# Patient Record
Sex: Male | Born: 1960 | Race: White | Hispanic: No | Marital: Married | Smoking: Never smoker
Health system: Southern US, Community
[De-identification: ages and names within clinical notes are randomized; demographics above are authoritative.]

## PROBLEM LIST (undated history)

## (undated) DIAGNOSIS — E785 Hyperlipidemia, unspecified: Secondary | ICD-10-CM

## (undated) DIAGNOSIS — N289 Disorder of kidney and ureter, unspecified: Secondary | ICD-10-CM

---

## 2005-11-20 ENCOUNTER — Inpatient Hospital Stay (HOSPITAL_COMMUNITY): Admission: EM | Admit: 2005-11-20 | Discharge: 2005-11-21 | Payer: Self-pay | Admitting: Emergency Medicine

## 2005-11-20 ENCOUNTER — Ambulatory Visit: Payer: Self-pay | Admitting: Internal Medicine

## 2005-11-26 ENCOUNTER — Ambulatory Visit: Payer: Self-pay

## 2012-01-07 ENCOUNTER — Emergency Department (HOSPITAL_COMMUNITY): Payer: BC Managed Care – PPO

## 2012-01-07 ENCOUNTER — Emergency Department (HOSPITAL_COMMUNITY)
Admission: EM | Admit: 2012-01-07 | Discharge: 2012-01-08 | Disposition: A | Discharge status: Home / Self Care | Payer: BC Managed Care – PPO | Attending: Emergency Medicine | Admitting: Emergency Medicine

## 2012-01-07 ENCOUNTER — Encounter (HOSPITAL_COMMUNITY): Payer: Self-pay | Admitting: *Deleted

## 2012-01-07 DIAGNOSIS — R0602 Shortness of breath: Secondary | ICD-10-CM | POA: Insufficient documentation

## 2012-01-07 DIAGNOSIS — R059 Cough, unspecified: Secondary | ICD-10-CM | POA: Insufficient documentation

## 2012-01-07 DIAGNOSIS — R05 Cough: Secondary | ICD-10-CM | POA: Insufficient documentation

## 2012-01-07 DIAGNOSIS — J40 Bronchitis, not specified as acute or chronic: Secondary | ICD-10-CM

## 2012-01-07 DIAGNOSIS — E785 Hyperlipidemia, unspecified: Secondary | ICD-10-CM | POA: Insufficient documentation

## 2012-01-07 HISTORY — DX: Hyperlipidemia, unspecified: E78.5

## 2012-01-07 MED ORDER — HYDROCOD POLST-CHLORPHEN POLST 10-8 MG/5ML PO LQCR
5.0000 mL | Freq: Once | ORAL | Status: AC
  Administered 2012-01-07: 5 mL via ORAL
  Filled 2012-01-07: qty 5

## 2012-01-07 MED ORDER — ALBUTEROL SULFATE HFA 108 (90 BASE) MCG/ACT IN AERS
2.0000 | INHALATION_SPRAY | Freq: Once | RESPIRATORY_TRACT | Status: AC
  Administered 2012-01-07: 2 via RESPIRATORY_TRACT
  Filled 2012-01-07: qty 6.7

## 2012-01-07 NOTE — ED Notes (Signed)
PA [facility] at bedside. 

## 2012-01-07 NOTE — ED Notes (Signed)
Pt c/o shortness of breath with cough for one week. Pt states cough is unproductive, but he can feel congestion in his chest. Pt states he has recently been treated for an ear infection. Pt states he feels "foggy".

## 2012-01-08 MED ORDER — HYDROCOD POLST-CHLORPHEN POLST 10-8 MG/5ML PO LQCR: 5.0000 mL | Freq: Two times a day (BID) | ORAL | Status: DC | PRN

## 2012-01-08 MED ORDER — AZITHROMYCIN 250 MG PO TABS: ORAL_TABLET | ORAL | Status: AC

## 2012-01-08 MED ORDER — AZITHROMYCIN 250 MG PO TABS
500.0000 mg | ORAL_TABLET | Freq: Once | ORAL | Status: AC
  Administered 2012-01-08: 500 mg via ORAL
  Filled 2012-01-08: qty 2

## 2012-01-08 NOTE — ED Provider Notes (Signed)
History     CSN: [id number]  Arrival date & time 01/07/12  2049   First MD Initiated Contact with Patient 01/07/12 2310      Chief Complaint  Patient presents with  . Shortness of Breath  . Cough    (Consider location/radiation/quality/duration/timing/severity/associated sxs/prior treatment) HPI Comments: Patient here with a day history of cough, shortness of breath and wheezing - states that he saw his PCP this past week, was diagnosed with an ear infection, place on amoxicillin for this but developed the cough without sputum production later in the week - he denies chest pain, reports shortness of breath particularly with deep cough, denies nausea, vomiting, runny nose, continued ear pain, sore throat - states has rattling sensation and wheezing noted in his chest when he coughs but is unable to bring anything up.  Denies fever but reports chills and night sweats.  Patient is a 51 y.o. male presenting with shortness of breath and cough. The history is provided by the patient. No language interpreter was used.  Shortness of Breath  The current episode started yesterday. The onset was gradual. The problem occurs frequently. The problem has been unchanged. The problem is moderate. The symptoms are relieved by nothing. The symptoms are aggravated by activity and a supine position. Associated symptoms include cough, shortness of breath and wheezing. Pertinent negatives include no chest pain, no chest pressure, no orthopnea, no fever, no rhinorrhea, no sore throat and no stridor. There was no intake of a foreign body. He has not inhaled smoke recently. He has had no prior steroid use. He has had no prior hospitalizations. He has had no prior ICU admissions. He has had no prior intubations. His past medical history does not include asthma, past wheezing or asthma in the family. Urine output has been normal. The last void occurred less than 6 hours ago. There were no sick contacts.  Cough Associated  symptoms include chills, shortness of breath and wheezing. Pertinent negatives include no chest pain, no rhinorrhea and no sore throat. His past medical history does not include asthma.    Past Medical History  Diagnosis Date  . Hyperlipidemia     No past surgical history on file.  History reviewed. No pertinent family history.  History  Substance Use Topics  . Smoking status: Never Smoker   . Smokeless tobacco: Not on file  . Alcohol Use: No      Review of Systems  Constitutional: Positive for chills. Negative for fever.  HENT: Negative for sore throat and rhinorrhea.   Respiratory: Positive for cough, shortness of breath and wheezing. Negative for stridor.   Cardiovascular: Negative for chest pain and orthopnea.  All other systems reviewed and are negative.    Allergies  Review of patient's allergies indicates no known allergies.  Home Medications   Current Outpatient Rx  Name Route Sig Dispense Refill  . ATORVASTATIN CALCIUM 80 MG PO TABS Oral Take 80 mg by mouth daily.    . BUPROPION HCL ER (SR) 150 MG PO TB12 Oral Take 150 mg by mouth 2 (two) times daily.    Marland Kitchen DM-PHENYLEPHRINE-ACETAMINOPHEN 10-5-325 MG/15ML PO LIQD Oral Take 15 mLs by mouth every 6 (six) hours as needed. Cold/ cough relief    . HYDROCODONE-HOMATROPINE 5-1.5 MG/5ML PO SYRP Oral Take 5 mLs by mouth every 6 (six) hours as needed. Cough suppresant    . IBUPROFEN 100 MG PO CHEW Oral Chew 100 mg by mouth every 8 (eight) hours as needed. Pain  BP 148/97  Pulse 80  Temp(Src) 98 F (36.7 C) (Oral)  Resp 20  SpO2 94%  Physical Exam  Nursing note and vitals reviewed. Constitutional: He is oriented to person, place, and time. He appears well-developed and well-nourished. No distress.  HENT:  Head: Normocephalic and atraumatic.  Right Ear: External ear normal.  Left Ear: External ear normal.  Nose: Nose normal.  Mouth/Throat: Oropharynx is clear and moist. No oropharyngeal exudate.  Eyes:  Conjunctivae are normal. Pupils are equal, round, and reactive to light. No scleral icterus.  Neck: Normal range of motion. Neck supple.  Cardiovascular: Normal rate, regular rhythm and normal heart sounds.  Exam reveals no gallop and no friction rub.   No murmur heard. Pulmonary/Chest: Effort normal. No respiratory distress. He has no wheezes. He has rhonchi in the right lower field. He has no rales. He exhibits no tenderness.       Rhonchi noted to RLL  Abdominal: Soft. Bowel sounds are normal. He exhibits no distension and no mass. There is no tenderness. There is no rebound and no guarding.  Musculoskeletal: Normal range of motion. He exhibits no edema and no tenderness.  Lymphadenopathy:    He has no cervical adenopathy.  Neurological: He is alert and oriented to person, place, and time. No cranial nerve deficit.  Skin: Skin is warm and dry. No rash noted. No erythema. No pallor.  Psychiatric: He has a normal mood and affect. His behavior is normal. Judgment and thought content normal.    ED Course  Procedures (including critical care time)  Labs Reviewed - No data to display Dg Chest 2 View  01/08/2012  *RADIOLOGY REPORT*  Clinical Data: Nonproductive cough.  Chest congestion.  Difficulty breathing.  Nonsmoker.  CHEST - 2 VIEW  Comparison: None.  Findings: Segmental elevations of the hemidiaphragms bilaterally. Normal heart size and pulmonary vascularity.  No focal airspace consolidation in the lungs.  No blunting of costophrenic angles. No pneumothorax.  IMPRESSION: No evidence of active pulmonary disease.  Original Report Authenticated By: Marlon Pel, M.D.     Bronchitis    MDM  Patient with recent history of OM who presents with cough and congestion that is non-productive - mild rhonchi noted to RLL with cough only - no chest pain with mild shortness of breath - feels better after tussionex and albuterol - I believe this to be more bronchitis in nature and will treat as  such.          Izola Price [place], [place] 01/08/12 [phone]

## 2012-01-08 NOTE — Discharge Instructions (Signed)

## 2012-01-08 NOTE — ED Notes (Signed)
Patient transported to X-ray 

## 2012-01-08 NOTE — ED Provider Notes (Signed)
Medical screening examination/treatment/procedure(s) were performed by non-physician practitioner and as supervising physician I was immediately available for consultation/collaboration.   Gavin Pound. Oletta Lamas, MD 01/08/12 [phone]

## 2012-01-08 NOTE — ED Notes (Signed)
PA [facility] at bedside. 

## 2015-09-07 ENCOUNTER — Emergency Department (HOSPITAL_COMMUNITY): Payer: Managed Care, Other (non HMO)

## 2015-09-07 ENCOUNTER — Encounter (HOSPITAL_COMMUNITY): Payer: Self-pay | Admitting: Emergency Medicine

## 2015-09-07 ENCOUNTER — Emergency Department (HOSPITAL_COMMUNITY)
Admission: EM | Admit: 2015-09-07 | Discharge: 2015-09-07 | Disposition: A | Discharge status: Home / Self Care | Payer: Managed Care, Other (non HMO) | Attending: Emergency Medicine | Admitting: Emergency Medicine

## 2015-09-07 DIAGNOSIS — E785 Hyperlipidemia, unspecified: Secondary | ICD-10-CM | POA: Diagnosis not present

## 2015-09-07 DIAGNOSIS — N200 Calculus of kidney: Secondary | ICD-10-CM | POA: Diagnosis not present

## 2015-09-07 DIAGNOSIS — R52 Pain, unspecified: Secondary | ICD-10-CM

## 2015-09-07 DIAGNOSIS — R1032 Left lower quadrant pain: Secondary | ICD-10-CM | POA: Diagnosis present

## 2015-09-07 DIAGNOSIS — Z79899 Other long term (current) drug therapy: Secondary | ICD-10-CM | POA: Diagnosis not present

## 2015-09-07 LAB — COMPREHENSIVE METABOLIC PANEL
ALT: 20 U/L (ref 17–63)
ANION GAP: 11 (ref 5–15)
AST: 18 U/L (ref 15–41)
Albumin: 4.4 g/dL (ref 3.5–5.0)
Alkaline Phosphatase: 65 U/L (ref 38–126)
BILIRUBIN TOTAL: 0.9 mg/dL (ref 0.3–1.2)
BUN: 22 mg/dL — ABNORMAL HIGH (ref 6–20)
CALCIUM: 9.6 mg/dL (ref 8.9–10.3)
CO2: 24 mmol/L (ref 22–32)
Chloride: 104 mmol/L (ref 101–111)
Creatinine, Ser: 1.33 mg/dL — ABNORMAL HIGH (ref 0.61–1.24)
GFR, EST NON AFRICAN AMERICAN: 60 mL/min — AB (ref >60)
Glucose, Bld: 115 mg/dL — ABNORMAL HIGH (ref 65–99)
POTASSIUM: 4.2 mmol/L (ref 3.5–5.1)
Sodium: 139 mmol/L (ref 135–145)
TOTAL PROTEIN: 6.9 g/dL (ref 6.5–8.1)

## 2015-09-07 LAB — URINE MICROSCOPIC-ADD ON
Bacteria, UA: NONE SEEN
SQUAMOUS EPITHELIAL / LPF: NONE SEEN

## 2015-09-07 LAB — CBC
HEMATOCRIT: 42.9 % (ref 39.0–52.0)
HEMOGLOBIN: 14.2 g/dL (ref 13.0–17.0)
MCH: 29.5 pg (ref 26.0–34.0)
MCHC: 33.1 g/dL (ref 30.0–36.0)
MCV: 89.2 fL (ref 78.0–100.0)
Platelets: 290 10*3/uL (ref 150–400)
RBC: 4.81 MIL/uL (ref 4.22–5.81)
RDW: 13.2 % (ref 11.5–15.5)
WBC: 9.1 10*3/uL (ref 4.0–10.5)

## 2015-09-07 LAB — URINALYSIS, ROUTINE W REFLEX MICROSCOPIC
Bilirubin Urine: NEGATIVE
Glucose, UA: NEGATIVE mg/dL
KETONES UR: NEGATIVE mg/dL
LEUKOCYTES UA: NEGATIVE
NITRITE: NEGATIVE
PH: 6 (ref 5.0–8.0)
Protein, ur: NEGATIVE mg/dL
SPECIFIC GRAVITY, URINE: 1.02 (ref 1.005–1.030)

## 2015-09-07 LAB — LIPASE, BLOOD: LIPASE: 27 U/L (ref 11–51)

## 2015-09-07 MED ORDER — HYDROMORPHONE HCL 1 MG/ML IJ SOLN
1.0000 mg | Freq: Once | INTRAMUSCULAR | Status: AC
  Administered 2015-09-07: 1 mg via INTRAVENOUS
  Filled 2015-09-07: qty 1

## 2015-09-07 MED ORDER — OXYCODONE-ACETAMINOPHEN 5-325 MG PO TABS: 1.0000 | ORAL_TABLET | Freq: Four times a day (QID) | ORAL | Status: AC | PRN

## 2015-09-07 MED ORDER — TAMSULOSIN HCL 0.4 MG PO CAPS: 0.4000 mg | ORAL_CAPSULE | Freq: Every day | ORAL | Status: AC

## 2015-09-07 MED ORDER — ONDANSETRON HCL 4 MG/2ML IJ SOLN
4.0000 mg | Freq: Once | INTRAMUSCULAR | Status: AC
  Administered 2015-09-07: 4 mg via INTRAVENOUS
  Filled 2015-09-07: qty 2

## 2015-09-07 MED ORDER — SODIUM CHLORIDE 0.9 % IV BOLUS (SEPSIS)
500.0000 mL | Freq: Once | INTRAVENOUS | Status: AC
  Administered 2015-09-07: 500 mL via INTRAVENOUS

## 2015-09-07 MED ORDER — ONDANSETRON 4 MG PO TBDP: ORAL_TABLET | ORAL | Status: DC

## 2015-09-07 MED ORDER — KETOROLAC TROMETHAMINE 30 MG/ML IJ SOLN
30.0000 mg | Freq: Once | INTRAMUSCULAR | Status: AC
  Administered 2015-09-07: 30 mg via INTRAVENOUS
  Filled 2015-09-07: qty 1

## 2015-09-07 NOTE — Discharge Instructions (Signed)
Follow up with [facility] in a week,

## 2015-09-07 NOTE — ED Provider Notes (Signed)
CSN: [id number]     Arrival date & time 09/07/15  [id number] History   First MD Initiated Contact with Patient 09/07/15 [id number]     Chief Complaint  Patient presents with  . Abdominal Pain     (Consider location/radiation/quality/duration/timing/severity/associated sxs/prior Treatment) Patient is a 54 y.o. male presenting with abdominal pain. The history is provided by the patient (The patient complains of left lower quadrant abdominal pain).  Abdominal Pain Pain location:  L flank Pain quality: aching   Pain radiates to:  Does not radiate Pain severity:  Moderate Onset quality:  Sudden Timing:  Constant Progression:  Worsening Chronicity:  New Context: not alcohol use   Associated symptoms: no chest pain, no cough, no diarrhea, no fatigue and no hematuria     Past Medical History  Diagnosis Date  . Hyperlipidemia    History reviewed. No pertinent past surgical history. No family history on file. Social History  Substance Use Topics  . Smoking status: Never Smoker   . Smokeless tobacco: None  . Alcohol Use: No    Review of Systems  Constitutional: Negative for appetite change and fatigue.  HENT: Negative for congestion, ear discharge and sinus pressure.   Eyes: Negative for discharge.  Respiratory: Negative for cough.   Cardiovascular: Negative for chest pain.  Gastrointestinal: Positive for abdominal pain. Negative for diarrhea.  Genitourinary: Negative for frequency and hematuria.  Musculoskeletal: Negative for back pain.  Skin: Negative for rash.  Neurological: Negative for seizures and headaches.  Psychiatric/Behavioral: Negative for hallucinations.      Allergies  Review of patient's allergies indicates no known allergies.  Home Medications   Prior to Admission medications   Medication Sig Start Date End Date Taking? Authorizing Provider  aspirin-acetaminophen-caffeine (EXCEDRIN MIGRAINE) [phone] MG tablet Take 2 tablets by mouth every 6 (six) hours as  needed for headache.   Yes Historical Provider, MD  buPROPion (WELLBUTRIN SR) 150 MG 12 hr tablet Take 150 mg by mouth daily as needed (for depressive feelings).    Yes Historical Provider, MD  ibuprofen (ADVIL,MOTRIN) 200 MG tablet Take 600 mg by mouth every 6 (six) hours as needed for moderate pain.   Yes Historical Provider, MD  Omega-3 Fatty Acids (FISH OIL) 1000 MG CAPS Take 1,000 mg by mouth daily.   Yes Historical Provider, MD  VIAGRA 100 MG tablet take 1 tablet by mouth daily as needed for sexual intercourse 08/12/15  Yes Historical Provider, MD  ondansetron (ZOFRAN ODT) 4 MG disintegrating tablet 4mg  ODT q4 hours prn nausea/vomit 09/07/15   Bethann BerkshireJoseph Labella Zahradnik, MD  oxyCODONE-acetaminophen (PERCOCET) 5-325 MG tablet Take 1 tablet by mouth every 6 (six) hours as needed. 09/07/15   Bethann BerkshireJoseph Tiare Rohlman, MD  tamsulosin (FLOMAX) 0.4 MG CAPS capsule Take 1 capsule (0.4 mg total) by mouth daily. 09/07/15   Bethann BerkshireJoseph Quinnten Calvin, MD   BP 137/88 mmHg  Pulse 66  Temp(Src) 98.1 F (36.7 C) (Oral)  Resp 14  Ht 5\' 11"  (1.803 m)  Wt 220 lb (99.791 kg)  BMI 30.70 kg/m2  SpO2 94% Physical Exam  Constitutional: He is oriented to person, place, and time. He appears well-developed.  HENT:  Head: Normocephalic.  Eyes: Conjunctivae and EOM are normal. No scleral icterus.  Neck: Neck supple. No thyromegaly present.  Cardiovascular: Normal rate and regular rhythm.  Exam reveals no gallop and no friction rub.   No murmur heard. Pulmonary/Chest: No stridor. He has no wheezes. He has no rales. He exhibits no tenderness.  Abdominal: He exhibits no distension.  There is tenderness. There is no rebound.  Tender left lower quadrant  Musculoskeletal: Normal range of motion. He exhibits no edema.  Lymphadenopathy:    He has no cervical adenopathy.  Neurological: He is oriented to person, place, and time. He exhibits normal muscle tone. Coordination normal.  Skin: No rash noted. No erythema.  Psychiatric: He has a normal mood  and affect. His behavior is normal.    ED Course  Procedures (including critical care time) Labs Review Labs Reviewed  COMPREHENSIVE METABOLIC PANEL - Abnormal; Notable for the following:    Glucose, Bld 115 (*)    BUN 22 (*)    Creatinine, Ser 1.33 (*)    GFR calc non Af Amer 60 (*)    All other components within normal limits  URINALYSIS, ROUTINE W REFLEX MICROSCOPIC (NOT AT [facility]) - Abnormal; Notable for the following:    Hgb urine dipstick SMALL (*)    All other components within normal limits  CBC  LIPASE, BLOOD  URINE MICROSCOPIC-ADD ON    Imaging Review Ct Renal Stone Study  09/07/2015  CLINICAL DATA:  Left abdominal pain radiating into the left groin EXAM: CT ABDOMEN AND PELVIS WITHOUT CONTRAST TECHNIQUE: Multidetector CT imaging of the abdomen and pelvis was performed following the standard protocol without IV contrast. COMPARISON:  None. FINDINGS: Mild left hydronephrosis is associated with a 2 mm calculus at the left ureterovesical junction. Bilateral nephrolithiasis is present. The largest renal calculus measures 10 mm in the lower pole of the right kidney. No right ureteral calculus. Liver, gallbladder, spleen, pancreas, adrenal glands are within normal limits Normal appendix Bladder and prostate are unremarkable.  Sigmoid diverticulosis. Left inguinal hernia contains adipose tissue only. No vertebral compression deformity. IMPRESSION: Bilateral nephrolithiasis. 2 mm left ureterovesical junction calculus is associated with mild left hydronephrosis. Electronically Signed   By: Jolaine Click M.D.   On: 09/07/2015 10:08   I have personally reviewed and evaluated these images and lab results as part of my medical decision-making.   EKG Interpretation None      MDM   Final diagnoses:  Pain  Kidney stone    CT shows kidney stone and left ureter. Patient will be given Percocets Zofran and Flomax and is instructed to follow-up with urology    Bethann Berkshire, MD 09/07/15  1120

## 2015-09-07 NOTE — ED Notes (Signed)
Pt escorted to discharge window. Pt verbalized understanding discharge instructions. In no acute distress.  

## 2015-09-07 NOTE — ED Notes (Addendum)
Pt from home c/o left side abdominal pain that radiates to groin. Denies urinary symptoms.  Pt reports dry heaves no actual vomiting, denies diarrhea.

## 2015-09-07 NOTE — ED Notes (Signed)
md at bedside  Pt alert and oriented x4. Respirations even and unlabored, bilateral symmetrical rise and fall of chest. Skin warm and dry. In no acute distress. Denies needs.   

## 2015-12-04 ENCOUNTER — Emergency Department (HOSPITAL_COMMUNITY): Payer: Managed Care, Other (non HMO)

## 2015-12-04 ENCOUNTER — Emergency Department (HOSPITAL_COMMUNITY)
Admission: EM | Admit: 2015-12-04 | Discharge: 2015-12-05 | Disposition: A | Discharge status: Home / Self Care | Payer: Managed Care, Other (non HMO) | Attending: Emergency Medicine | Admitting: Emergency Medicine

## 2015-12-04 ENCOUNTER — Encounter (HOSPITAL_COMMUNITY): Payer: Self-pay | Admitting: Emergency Medicine

## 2015-12-04 DIAGNOSIS — R1032 Left lower quadrant pain: Secondary | ICD-10-CM | POA: Diagnosis not present

## 2015-12-04 DIAGNOSIS — Z8639 Personal history of other endocrine, nutritional and metabolic disease: Secondary | ICD-10-CM | POA: Insufficient documentation

## 2015-12-04 DIAGNOSIS — M791 Myalgia: Secondary | ICD-10-CM | POA: Diagnosis not present

## 2015-12-04 DIAGNOSIS — R1013 Epigastric pain: Secondary | ICD-10-CM | POA: Diagnosis not present

## 2015-12-04 DIAGNOSIS — R1012 Left upper quadrant pain: Secondary | ICD-10-CM | POA: Diagnosis not present

## 2015-12-04 DIAGNOSIS — Z87442 Personal history of urinary calculi: Secondary | ICD-10-CM | POA: Diagnosis not present

## 2015-12-04 DIAGNOSIS — R1011 Right upper quadrant pain: Secondary | ICD-10-CM | POA: Insufficient documentation

## 2015-12-04 DIAGNOSIS — Z79899 Other long term (current) drug therapy: Secondary | ICD-10-CM | POA: Diagnosis not present

## 2015-12-04 DIAGNOSIS — M7918 Myalgia, other site: Secondary | ICD-10-CM

## 2015-12-04 DIAGNOSIS — R109 Unspecified abdominal pain: Secondary | ICD-10-CM | POA: Diagnosis present

## 2015-12-04 MED ORDER — KETOROLAC TROMETHAMINE 15 MG/ML IJ SOLN
15.0000 mg | Freq: Once | INTRAMUSCULAR | Status: AC
  Administered 2015-12-05: 15 mg via INTRAVENOUS
  Filled 2015-12-04: qty 1

## 2015-12-04 MED ORDER — DIAZEPAM 2 MG PO TABS
2.0000 mg | ORAL_TABLET | Freq: Once | ORAL | Status: AC
  Administered 2015-12-05: 2 mg via ORAL
  Filled 2015-12-04: qty 1

## 2015-12-04 NOTE — ED Provider Notes (Signed)
CSN: [id number]     Arrival date & time 12/04/15  2011 History  By signing my name below, I, Randall Singh, attest that this documentation has been prepared under the direction and in the presence of Derwood Kaplan, MD. Electronically Signed: Bethel Singh, ED Scribe. 12/05/2015. 2:14 AM   Chief Complaint  Patient presents with  . Flank Pain    The history is provided by the patient. No language interpreter was used.   Randall Singh is a 55 y.o. male with history of kidney stones who presents to the Emergency Department complaining of worsening, 9/10 in severity, sharp/throbbing left flank pain with onset 2-3 days ago. The pain was intermittent but has been constant today. When he lays flat the pain radiates to the left side of the abdomen. Walking and position changes exacerbate the pain. He has had similar symptoms in the past with kidney stones. He last passed a stone 4 months ago and it was on the left. Pt states that he has known stones on both sides. Associated symptoms include night sweats for 2 nights and chills. Pt denies nausea, vomiting, sweating during the day, fever, cough, SOB, dysuria, hematuria, increased frequency, rash, and chest pain. No personal history of cancer. No history of abdominal surgery. He is not currently on any steroids or diet pills. Pt denies smoking and heavy alcohol use.   Past Medical History  Diagnosis Date  . Hyperlipidemia    History reviewed. No pertinent past surgical history. Family History  Problem Relation Age of Onset  . CAD Father   . Pancreatitis Father    Social History  Substance Use Topics  . Smoking status: Never Smoker   . Smokeless tobacco: None  . Alcohol Use: No    Review of Systems  10 Systems reviewed and all are negative for acute change except as noted in the HPI.   Allergies  Review of patient's allergies indicates no known allergies.  Home Medications   Prior to Admission medications   Medication Sig Start  Date End Date Taking? Authorizing Provider  buPROPion (WELLBUTRIN SR) 150 MG 12 hr tablet Take 150 mg by mouth daily as needed (for depressive feelings).    Yes Historical Provider, MD  lisinopril (PRINIVIL,ZESTRIL) 10 MG tablet Take 10 mg by mouth daily. 10/31/15  Yes Historical Provider, MD  VIAGRA 100 MG tablet take 100 mg by mouth daily as needed for sexual intercourse 08/12/15  Yes Historical Provider, MD  methocarbamol (ROBAXIN) 500 MG tablet Take 1 tablet (500 mg total) by mouth 2 (two) times daily. 12/05/15   Derwood Kaplan, MD  naproxen (NAPROSYN) 500 MG tablet Take 1 tablet (500 mg total) by mouth 2 (two) times daily with a meal. 12/05/15   Derwood Kaplan, MD  ondansetron (ZOFRAN ODT) 4 MG disintegrating tablet  ODT q4 hours prn nausea/vomit Patient not taking: Reported on 12/04/2015 09/07/15   Bethann Berkshire, MD  oxyCODONE-acetaminophen (PERCOCET) 5-325 MG tablet Take 1 tablet by mouth every 6 (six) hours as needed. Patient not taking: Reported on 12/04/2015 09/07/15   Bethann Berkshire, MD  tamsulosin (FLOMAX) 0.4 MG CAPS capsule Take 1 capsule (0.4 mg total) by mouth daily. Patient not taking: Reported on 12/04/2015 09/07/15   Bethann Berkshire, MD   BP 123/89 mmHg  Pulse 76  Temp(Src) 98.1 F (36.7 C) (Oral)  Resp 15  Ht  (1.803 m)  Wt 225 lb (102.059 kg)  BMI 31.39 kg/m2  SpO2 99% Physical Exam  Constitutional: He is oriented to person,  place, and time. He appears well-developed and well-nourished.  HENT:  Head: Normocephalic and atraumatic.  Eyes: EOM are normal.  Neck: Normal range of motion.  Cardiovascular: Normal rate, regular rhythm, normal heart sounds and intact distal pulses.   Pulses:      Radial pulses are 2+ on the right side, and 2+ on the left side.  2+ and equal radial pulse bilaterally  Pulmonary/Chest: Effort normal and breath sounds normal. No respiratory distress.  CTAB  Abdominal: Soft. He exhibits no distension. There is tenderness in the right upper  quadrant, epigastric area, left upper quadrant and left lower quadrant.  Musculoskeletal: Normal range of motion.  No point tenderness over the spine Left sided lower paraspinal tenderness  Neurological: He is alert and oriented to person, place, and time.  Skin: Skin is warm and dry.  No rash noted to the back or torso  Psychiatric: He has a normal mood and affect. Judgment normal.  Nursing note and vitals reviewed.   ED Course  Procedures (including critical care time) DIAGNOSTIC STUDIES: Oxygen Saturation is 99% on RA,  normal by my interpretation.    COORDINATION OF CARE: 11:28 PM Discussed treatment plan which includes lab work, renal US, pain management with pt at bedside and pt agreed to plan.  2:11 AM I re-evaluated the patient and provided an update on the results of US. On repeat exam abd is soft and non tender. Pt continues to have left paraspinal TTP, worse with movement.    Labs Review Labs Reviewed  BASIC METABOLIC PANEL - Abnormal; Notable for the following:    Glucose, Bld 103 (*)    BUN 30 (*)    All other components within normal limits  CBC WITH DIFFERENTIAL/PLATELET  URINALYSIS, ROUTINE W REFLEX MICROSCOPIC (NOT [facility])  HEPATIC FUNCTION PANEL  LIPASE, BLOOD  SEDIMENTATION RATE    Imaging Review No results found. I have personally reviewed and evaluated these images and lab results as part of my medical decision-making.   EKG Interpretation None      MDM   Final diagnoses:  Musculoskeletal pain  Flank pain, acute    I personally performed the services described in this documentation, which was scribed in my presence. The recorded information has been reviewed and is accurate.  PT comes in with flank pain, L side. Hx of renal stones. No uti like symptoms. Pain started earlier -intermittent to constant. Exam reveals generalized tenderness, but the worst of it is in the left flank region. Tenderness is focal - almost like a structural etiology as  the cause. However, we proceeded with basic labs and US renal. Pt was reassessed post workup concluded - along with the vitals, he has completely normal labs and US results.  At that time repeat abd exam was undertaken - and the pain is still very focal in the L flank region. Spine was completely non tender. Not sure what to make of the diaphoresis. Previous CT results discussed and reviewed. We discussed merits of CT scan - no clear indication for it. Pt is comfortable treating this as a MS pain.  Strict ER return precautions have been discussed, and patient is agreeing with the plan and is comfortable with the workup done and the recommendations from the ER.    Derwood KaplanAnkit Kylene Zamarron, MD 12/07/15 [id number]

## 2015-12-04 NOTE — ED Notes (Signed)
Pt states he has pain in his left kidney area  Pt states he has known kidney stones  Pt states the pain is like a sharp throbbing pain  Denies N/V  Pt states he did have some night sweats last night

## 2015-12-05 LAB — CBC WITH DIFFERENTIAL/PLATELET
BASOS ABS: 0 10*3/uL (ref 0.0–0.1)
BASOS PCT: 1 %
EOS ABS: 0.3 10*3/uL (ref 0.0–0.7)
EOS PCT: 4 %
HCT: 42.1 % (ref 39.0–52.0)
HEMOGLOBIN: 14.3 g/dL (ref 13.0–17.0)
LYMPHS ABS: 2.1 10*3/uL (ref 0.7–4.0)
LYMPHS PCT: 26 %
MCH: 29.7 pg (ref 26.0–34.0)
MCHC: 34 g/dL (ref 30.0–36.0)
MCV: 87.3 fL (ref 78.0–100.0)
Monocytes Absolute: 0.6 10*3/uL (ref 0.1–1.0)
Monocytes Relative: 8 %
NEUTROS ABS: 5 10*3/uL (ref 1.7–7.7)
Neutrophils Relative %: 61 %
PLATELETS: 276 10*3/uL (ref 150–400)
RBC: 4.82 MIL/uL (ref 4.22–5.81)
RDW: 13.3 % (ref 11.5–15.5)
WBC: 8 10*3/uL (ref 4.0–10.5)

## 2015-12-05 LAB — BASIC METABOLIC PANEL
Anion gap: 9 (ref 5–15)
BUN: 30 mg/dL — AB (ref 6–20)
CALCIUM: 9.7 mg/dL (ref 8.9–10.3)
CHLORIDE: 110 mmol/L (ref 101–111)
CO2: 23 mmol/L (ref 22–32)
CREATININE: 1.21 mg/dL (ref 0.61–1.24)
Glucose, Bld: 103 mg/dL — ABNORMAL HIGH (ref 65–99)
Potassium: 4.1 mmol/L (ref 3.5–5.1)
SODIUM: 142 mmol/L (ref 135–145)

## 2015-12-05 LAB — URINALYSIS, ROUTINE W REFLEX MICROSCOPIC
BILIRUBIN URINE: NEGATIVE
Glucose, UA: NEGATIVE mg/dL
HGB URINE DIPSTICK: NEGATIVE
KETONES UR: NEGATIVE mg/dL
Leukocytes, UA: NEGATIVE
NITRITE: NEGATIVE
PROTEIN: NEGATIVE mg/dL
SPECIFIC GRAVITY, URINE: 1.025 (ref 1.005–1.030)
pH: 6 (ref 5.0–8.0)

## 2015-12-05 LAB — HEPATIC FUNCTION PANEL
ALK PHOS: 64 U/L (ref 38–126)
ALT: 30 U/L (ref 17–63)
AST: 29 U/L (ref 15–41)
Albumin: 4 g/dL (ref 3.5–5.0)
BILIRUBIN DIRECT: 0.1 mg/dL (ref 0.1–0.5)
BILIRUBIN TOTAL: 0.5 mg/dL (ref 0.3–1.2)
Indirect Bilirubin: 0.4 mg/dL (ref 0.3–0.9)
Total Protein: 6.8 g/dL (ref 6.5–8.1)

## 2015-12-05 LAB — SEDIMENTATION RATE: SED RATE: 7 mm/h (ref 0–16)

## 2015-12-05 LAB — LIPASE, BLOOD: Lipase: 39 U/L (ref 11–51)

## 2015-12-05 MED ORDER — METHOCARBAMOL 500 MG PO TABS: 500.0000 mg | ORAL_TABLET | Freq: Two times a day (BID) | ORAL | Status: AC

## 2015-12-05 MED ORDER — NAPROXEN 500 MG PO TABS: 500.0000 mg | ORAL_TABLET | Freq: Two times a day (BID) | ORAL | Status: AC

## 2015-12-05 NOTE — Discharge Instructions (Signed)
Please return to the ER if your symptoms worsen; you have increased pain, fevers, chills, inability to keep any medications down, burning with urination or bloody urine. Otherwise see the outpatient doctor as requested.   Flank Pain Flank pain refers to pain that is located on the side of the body between the upper abdomen and the back. The pain may occur over a short period of time (acute) or may be long-term or reoccurring (chronic). It may be mild or severe. Flank pain can be caused by many things. CAUSES  Some of the more common causes of flank pain include:  Muscle strains.   Muscle spasms.   A disease of your spine (vertebral disk disease).   A lung infection (pneumonia).   Fluid around your lungs (pulmonary edema).   A kidney infection.   Kidney stones.   A very painful skin rash caused by the chickenpox virus (shingles).   Gallbladder disease.  HOME CARE INSTRUCTIONS  Home care will depend on the cause of your pain. In general,  Rest as directed by your caregiver.  Drink enough fluids to keep your urine clear or pale yellow.  Only take over-the-counter or prescription medicines as directed by your caregiver. Some medicines may help relieve the pain.  Tell your caregiver about any changes in your pain.  Follow up with your caregiver as directed. SEEK IMMEDIATE MEDICAL CARE IF:   Your pain is not controlled with medicine.   You have new or worsening symptoms.  Your pain increases.   You have abdominal pain.   You have shortness of breath.   You have persistent nausea or vomiting.   You have swelling in your abdomen.   You feel faint or pass out.   You have blood in your urine.  You have a fever or persistent symptoms for more than 2-3 days.  You have a fever and your symptoms suddenly get worse. MAKE SURE YOU:   Understand these instructions.  Will watch your condition.  Will get help right away if you are not doing well or get  worse.   This information is not intended to replace advice given to you by your health care provider. Make sure you discuss any questions you have with your health care provider.   Document Released: 10/28/2005 Document Revised: 05/31/2012 Document Reviewed: 04/20/2012 Elsevier Interactive Patient Education 2016 [employer]  Musculoskeletal Pain Musculoskeletal pain is muscle and boney aches and pains. These pains can occur in any part of the body. Your caregiver may treat you without knowing the cause of the pain. They may treat you if blood or urine tests, X-rays, and other tests were normal.  CAUSES There is often not a definite cause or reason for these pains. These pains may be caused by a type of germ (virus). The discomfort may also come from overuse. Overuse includes working out too hard when your body is not fit. Boney aches also come from weather changes. Bone is sensitive to atmospheric pressure changes. HOME CARE INSTRUCTIONS   Ask when your test results will be ready. Make sure you get your test results.  Only take over-the-counter or prescription medicines for pain, discomfort, or fever as directed by your caregiver. If you were given medications for your condition, do not drive, operate machinery or power tools, or sign legal documents for 24 hours. Do not drink alcohol. Do not take sleeping pills or other medications that may interfere with treatment.  Continue all activities unless the activities cause more pain.  When the pain lessens, slowly resume normal activities. Gradually increase the intensity and duration of the activities or exercise.  During periods of severe pain, bed rest may be helpful. Lay or sit in any position that is comfortable.  Putting ice on the injured area.  Put ice in a bag.  Place a towel between your skin and the bag.  Leave the ice on for 15 to 20 minutes, 3 to 4 times a day.  Follow up with your caregiver for continued problems and no  reason can be found for the pain. If the pain becomes worse or does not go away, it may be necessary to repeat tests or do additional testing. Your caregiver may need to look further for a possible cause. SEEK IMMEDIATE MEDICAL CARE IF:  You have pain that is getting worse and is not relieved by medications.  You develop chest pain that is associated with shortness or breath, sweating, feeling sick to your stomach (nauseous), or throw up (vomit).  Your pain becomes localized to the abdomen.  You develop any new symptoms that seem different or that concern you. MAKE SURE YOU:   Understand these instructions.  Will watch your condition.  Will get help right away if you are not doing well or get worse.   This information is not intended to replace advice given to you by your health care provider. Make sure you discuss any questions you have with your health care provider.   Document Released: 09/06/2005 Document Revised: 11/29/2011 Document Reviewed: 05/11/2013 Elsevier Interactive Patient Education 2016 Elsevier Inc. RICE for Routine Care of Injuries Theroutine careofmanyinjuriesincludes rest, ice, compression, and elevation (RICE therapy). RICE therapy is often recommended for injuries to soft tissues, such as a muscle strain, ligament injuries, bruises, and overuse injuries. It can also be used for some bony injuries. Using RICE therapy can help to relieve pain, lessen swelling, and enable your body to heal. Rest Rest is required to allow your body to heal. This usually involves reducing your normal activities and avoiding use of the injured part of your body. Generally, you can return to your normal activities when you are comfortable and have been given permission by your health care provider. Ice Icing your injury helps to keep the swelling down, and it lessens pain. Do not apply ice directly to your skin.  Put ice in a plastic bag.  Place a towel between your skin and the  bag.  Leave the ice on for 20 minutes, 2-3 times a day. Do this for as long as you are directed by your health care provider. Compression Compression means putting pressure on the injured area. Compression helps to keep swelling down, gives support, and helps with discomfort. Compression may be done with an elastic bandage. If an elastic bandage has been applied, follow these general tips:  Remove and reapply the bandage every 3-4 hours or as directed by your health care provider.  Make sure the bandage is not wrapped too tightly, because this can cut off circulation. If part of your body beyond the bandage becomes blue, numb, cold, swollen, or more painful, your bandage is most likely too tight. If this occurs, remove your bandage and reapply it more loosely.  See your health care provider if the bandage seems to be making your problems worse rather than better. Elevation Elevation means keeping the injured area raised. This helps to lessen swelling and decrease pain. If possible, your injured area should be elevated at or above the level  of your heart or the center of your chest. WHEN SHOULD I SEEK MEDICAL CARE? You should seek medical care if:  Your pain and swelling continue.  Your symptoms are getting worse rather than improving. These symptoms may indicate that further evaluation or further X-rays are needed. Sometimes, X-rays may not show a small broken bone (fracture) until a number of days later. Make a follow-up appointment with your health care provider. WHEN SHOULD I SEEK IMMEDIATE MEDICAL CARE? You should seek immediate medical care if:  You have sudden severe pain at or below the area of your injury.  You have redness or increased swelling around your injury.  You have tingling or numbness at or below the area of your injury that does not improve after you remove the elastic bandage.   This information is not intended to replace advice given to you by your health care  provider. Make sure you discuss any questions you have with your health care provider.   Document Released: 12/19/2000 Document Revised: 05/28/2015 Document Reviewed: 08/14/2014 Elsevier Interactive Patient Education [employer]

## 2015-12-05 NOTE — ED Notes (Signed)
SL, 18 g removed from RFA, catheter intact.  Gauze dressing applied, site unremarkable.

## 2015-12-25 ENCOUNTER — Emergency Department (HOSPITAL_COMMUNITY): Payer: Managed Care, Other (non HMO)

## 2015-12-25 ENCOUNTER — Emergency Department (HOSPITAL_COMMUNITY)
Admission: EM | Admit: 2015-12-25 | Discharge: 2015-12-25 | Disposition: A | Discharge status: Home / Self Care | Payer: Managed Care, Other (non HMO) | Attending: Emergency Medicine | Admitting: Emergency Medicine

## 2015-12-25 ENCOUNTER — Encounter (HOSPITAL_COMMUNITY): Payer: Self-pay | Admitting: Emergency Medicine

## 2015-12-25 DIAGNOSIS — K529 Noninfective gastroenteritis and colitis, unspecified: Secondary | ICD-10-CM | POA: Insufficient documentation

## 2015-12-25 DIAGNOSIS — R1011 Right upper quadrant pain: Secondary | ICD-10-CM | POA: Diagnosis present

## 2015-12-25 DIAGNOSIS — Z87442 Personal history of urinary calculi: Secondary | ICD-10-CM | POA: Insufficient documentation

## 2015-12-25 DIAGNOSIS — Z87448 Personal history of other diseases of urinary system: Secondary | ICD-10-CM | POA: Insufficient documentation

## 2015-12-25 DIAGNOSIS — R109 Unspecified abdominal pain: Secondary | ICD-10-CM

## 2015-12-25 DIAGNOSIS — Z8639 Personal history of other endocrine, nutritional and metabolic disease: Secondary | ICD-10-CM | POA: Diagnosis not present

## 2015-12-25 DIAGNOSIS — K769 Liver disease, unspecified: Secondary | ICD-10-CM | POA: Diagnosis not present

## 2015-12-25 DIAGNOSIS — K5790 Diverticulosis of intestine, part unspecified, without perforation or abscess without bleeding: Secondary | ICD-10-CM | POA: Diagnosis not present

## 2015-12-25 DIAGNOSIS — Z79899 Other long term (current) drug therapy: Secondary | ICD-10-CM | POA: Insufficient documentation

## 2015-12-25 DIAGNOSIS — R112 Nausea with vomiting, unspecified: Secondary | ICD-10-CM

## 2015-12-25 HISTORY — DX: Disorder of kidney and ureter, unspecified: N28.9

## 2015-12-25 LAB — CBC WITH DIFFERENTIAL/PLATELET
Basophils Absolute: 0 10*3/uL (ref 0.0–0.1)
Basophils Relative: 0 %
Eosinophils Absolute: 0.2 10*3/uL (ref 0.0–0.7)
Eosinophils Relative: 2 %
HCT: 49.4 % (ref 39.0–52.0)
Hemoglobin: 16.6 g/dL (ref 13.0–17.0)
Lymphocytes Relative: 9 %
Lymphs Abs: 1.2 10*3/uL (ref 0.7–4.0)
MCH: 29.6 pg (ref 26.0–34.0)
MCHC: 33.6 g/dL (ref 30.0–36.0)
MCV: 88.2 fL (ref 78.0–100.0)
Monocytes Absolute: 0.7 10*3/uL (ref 0.1–1.0)
Monocytes Relative: 5 %
Neutro Abs: 10.8 10*3/uL — ABNORMAL HIGH (ref 1.7–7.7)
Neutrophils Relative %: 84 %
Platelets: 303 10*3/uL (ref 150–400)
RBC: 5.6 MIL/uL (ref 4.22–5.81)
RDW: 13.6 % (ref 11.5–15.5)
WBC: 12.9 10*3/uL — ABNORMAL HIGH (ref 4.0–10.5)

## 2015-12-25 LAB — LIPASE, BLOOD: LIPASE: 28 U/L (ref 11–51)

## 2015-12-25 LAB — URINALYSIS, ROUTINE W REFLEX MICROSCOPIC
Bilirubin Urine: NEGATIVE
Glucose, UA: NEGATIVE mg/dL
Hgb urine dipstick: NEGATIVE
Ketones, ur: NEGATIVE mg/dL
Leukocytes, UA: NEGATIVE
Nitrite: NEGATIVE
Protein, ur: NEGATIVE mg/dL
Specific Gravity, Urine: 1.02 (ref 1.005–1.030)
pH: 6 (ref 5.0–8.0)

## 2015-12-25 LAB — COMPREHENSIVE METABOLIC PANEL
ALT: 27 U/L (ref 17–63)
AST: 19 U/L (ref 15–41)
Albumin: 5 g/dL (ref 3.5–5.0)
Alkaline Phosphatase: 72 U/L (ref 38–126)
Anion gap: 12 (ref 5–15)
BUN: 18 mg/dL (ref 6–20)
CHLORIDE: 102 mmol/L (ref 101–111)
CO2: 26 mmol/L (ref 22–32)
Calcium: 10.2 mg/dL (ref 8.9–10.3)
Creatinine, Ser: 1.03 mg/dL (ref 0.61–1.24)
GFR calc Af Amer: >60 mL/min (ref >60)
Glucose, Bld: 97 mg/dL (ref 65–99)
POTASSIUM: 3.9 mmol/L (ref 3.5–5.1)
SODIUM: 140 mmol/L (ref 135–145)
Total Bilirubin: 0.7 mg/dL (ref 0.3–1.2)
Total Protein: 8 g/dL (ref 6.5–8.1)

## 2015-12-25 LAB — I-STAT TROPONIN, ED
TROPONIN I, POC: 0 ng/mL (ref 0.00–0.08)
Troponin i, poc: 0.01 ng/mL (ref 0.00–0.08)

## 2015-12-25 MED ORDER — ONDANSETRON HCL 4 MG/2ML IJ SOLN
4.0000 mg | Freq: Once | INTRAMUSCULAR | Status: AC
  Administered 2015-12-25: 4 mg via INTRAVENOUS
  Filled 2015-12-25: qty 2

## 2015-12-25 MED ORDER — IOHEXOL 300 MG/ML  SOLN
25.0000 mL | Freq: Once | INTRAMUSCULAR | Status: AC | PRN
  Administered 2015-12-25: 25 mL via ORAL

## 2015-12-25 MED ORDER — SODIUM CHLORIDE 0.9 % IV BOLUS (SEPSIS)
1000.0000 mL | Freq: Once | INTRAVENOUS | Status: AC
  Administered 2015-12-25: 1000 mL via INTRAVENOUS

## 2015-12-25 MED ORDER — GI COCKTAIL ~~LOC~~
30.0000 mL | Freq: Once | ORAL | Status: AC
  Administered 2015-12-25: 30 mL via ORAL
  Filled 2015-12-25: qty 30

## 2015-12-25 MED ORDER — IOPAMIDOL (ISOVUE-300) INJECTION 61%
100.0000 mL | Freq: Once | INTRAVENOUS | Status: AC | PRN
  Administered 2015-12-25: 100 mL via INTRAVENOUS

## 2015-12-25 MED ORDER — ONDANSETRON 4 MG PO TBDP: 4.0000 mg | ORAL_TABLET | Freq: Three times a day (TID) | ORAL | Status: AC | PRN

## 2015-12-25 MED ORDER — KETOROLAC TROMETHAMINE 30 MG/ML IJ SOLN
30.0000 mg | Freq: Once | INTRAMUSCULAR | Status: DC
  Filled 2015-12-25: qty 1

## 2015-12-25 MED ORDER — MORPHINE SULFATE (PF) 4 MG/ML IV SOLN
4.0000 mg | Freq: Once | INTRAVENOUS | Status: AC
  Administered 2015-12-25: 4 mg via INTRAVENOUS
  Filled 2015-12-25: qty 1

## 2015-12-25 MED ORDER — RANITIDINE HCL 150 MG PO CAPS: 150.0000 mg | ORAL_CAPSULE | Freq: Two times a day (BID) | ORAL | Status: AC

## 2015-12-25 MED ORDER — DICYCLOMINE HCL 20 MG PO TABS: 20.0000 mg | ORAL_TABLET | Freq: Three times a day (TID) | ORAL | Status: AC | PRN

## 2015-12-25 NOTE — ED Notes (Addendum)
Patient ambulated independently to restroom with steady gait. 

## 2015-12-25 NOTE — ED Provider Notes (Signed)
CSN: [id number]     Arrival date & time 12/25/15  1428 History   First MD Initiated Contact with Patient 12/25/15 1546     Chief Complaint  Patient presents with  . Flank Pain     (Consider location/radiation/quality/duration/timing/severity/associated sxs/prior Treatment) HPI Comments: RUQ/epigastric pain Started 1hr after eating Reports feeling like prior kidney stones Started 4hrs ago, worsening pain Sharp pain   Patient is a 55 y.o. male presenting with flank pain.  Flank Pain Pertinent negatives include no chest pain, no abdominal pain, no headaches and no shortness of breath.    Past Medical History  Diagnosis Date  . Hyperlipidemia   . Renal disorder    History reviewed. No pertinent past surgical history. Family History  Problem Relation Age of Onset  . CAD Father   . Pancreatitis Father    Social History  Substance Use Topics  . Smoking status: Never Smoker   . Smokeless tobacco: None  . Alcohol Use: No    Review of Systems  Constitutional: Negative for fever.  HENT: Negative for sore throat.   Eyes: Negative for visual disturbance.  Respiratory: Positive for cough. Negative for shortness of breath.   Cardiovascular: Negative for chest pain.  Gastrointestinal: Positive for nausea and vomiting. Negative for abdominal pain, diarrhea and constipation.  Genitourinary: Negative for flank pain and difficulty urinating.  Musculoskeletal: Negative for back pain and neck stiffness.  Skin: Negative for rash.  Neurological: Negative for syncope and headaches.      Allergies  Review of patient's allergies indicates no known allergies.  Home Medications   Prior to Admission medications   Medication Sig Start Date End Date Taking? Authorizing Provider  buPROPion (WELLBUTRIN SR) 150 MG 12 hr tablet Take 150 mg by mouth 2 (two) times daily.    Yes Historical Provider, MD  esomeprazole (NEXIUM) 40 MG capsule Take 40 mg by mouth daily at 12 noon.   Yes Historical  Provider, MD  methocarbamol (ROBAXIN) 500 MG tablet Take 1 tablet (500 mg total) by mouth 2 (two) times daily. Patient taking differently: Take 500 mg by mouth every 6 (six) hours as needed for muscle spasms.  12/05/15  Yes Derwood KaplanAnkit Nanavati, MD  VIAGRA 100 MG tablet take 50 mg by mouth daily as needed for sexual intercourse 08/12/15  Yes Historical Provider, MD  dicyclomine (BENTYL) 20 MG tablet Take 1 tablet (20 mg total) by mouth 3 (three) times daily as needed for spasms. 12/25/15   Alvira MondayErin Kaleya Douse, MD  naproxen (NAPROSYN) 500 MG tablet Take 1 tablet (500 mg total) by mouth 2 (two) times daily with a meal. Patient not taking: Reported on 12/25/2015 12/05/15   Derwood KaplanAnkit Nanavati, MD  ondansetron (ZOFRAN ODT) 4 MG disintegrating tablet Take 1 tablet (4 mg total) by mouth every 8 (eight) hours as needed for nausea or vomiting. 12/25/15   Alvira MondayErin Jaxxon Naeem, MD  oxyCODONE-acetaminophen (PERCOCET) 5-325 MG tablet Take 1 tablet by mouth every 6 (six) hours as needed. Patient not taking: Reported on 12/04/2015 09/07/15   Bethann BerkshireJoseph Zammit, MD  ranitidine (ZANTAC) 150 MG capsule Take 1 capsule (150 mg total) by mouth 2 (two) times daily. 12/25/15   Alvira MondayErin Oneka Parada, MD  tamsulosin (FLOMAX) 0.4 MG CAPS capsule Take 1 capsule (0.4 mg total) by mouth daily. Patient not taking: Reported on 12/04/2015 09/07/15   Bethann BerkshireJoseph Zammit, MD   BP 133/82 mmHg  Pulse 79  Temp(Src) 98.3 F (36.8 C) (Oral)  Resp 19  SpO2 93% Physical Exam  Constitutional: He is  oriented to person, place, and time. He appears well-developed and well-nourished. No distress.  HENT:  Head: Normocephalic and atraumatic.  Eyes: Conjunctivae and EOM are normal.  Neck: Normal range of motion.  Cardiovascular: Normal rate, regular rhythm, normal heart sounds and intact distal pulses.  Exam reveals no gallop and no friction rub.   No murmur heard. Pulmonary/Chest: Effort normal and breath sounds normal. No respiratory distress. He has no wheezes. He has no rales.   Abdominal: Soft. He exhibits no distension. There is tenderness (mild, diffuse, worse in RLQ and RUQ). There is no guarding and no CVA tenderness.  Musculoskeletal: He exhibits no edema.  Neurological: He is alert and oriented to person, place, and time.  Skin: Skin is warm and dry. He is not diaphoretic.  Nursing note and vitals reviewed.   ED Course  Procedures (including critical care time) Labs Review Labs Reviewed  URINALYSIS, ROUTINE W REFLEX MICROSCOPIC (NOT AT [facility]) - Abnormal; Notable for the following:    APPearance CLOUDY (*)    All other components within normal limits  CBC WITH DIFFERENTIAL/PLATELET - Abnormal; Notable for the following:    WBC 12.9 (*)    Neutro Abs 10.8 (*)    All other components within normal limits  COMPREHENSIVE METABOLIC PANEL  LIPASE, BLOOD  I-STAT TROPOININ, ED  I-STAT TROPOININ, ED    Imaging Review Dg Abd 1 View  12/25/2015  CLINICAL DATA:  Hx kidney stones. Bilateral flank pain per pt, started around 2 hours ago. EXAM: ABDOMEN - 1 VIEW COMPARISON:  None. FINDINGS: 7 mm right flank calcification possibly a renal calculus. Loop of small bowel left upper quadrant with evidence of wall thickening. Moderate fecal retention including mild impaction in the rectum. IMPRESSION: Cannot exclude inflamed loop of left upper quadrant small bowel. Mild fecal impaction Possible 7 mm right renal stone. Electronically Signed   By: Esperanza Heir M.D.   On: 12/25/2015 16:11   Ct Abdomen Pelvis W Contrast  12/25/2015  CLINICAL DATA:  History of kidney stones, right flank pain starting 2 hours ago, possible appendicitis EXAM: CT ABDOMEN AND PELVIS WITH CONTRAST TECHNIQUE: Multidetector CT imaging of the abdomen and pelvis was performed using the standard protocol following bolus administration of intravenous contrast. CONTRAST:  25mL OMNIPAQUE IOHEXOL 300 MG/ML SOLN, ISOVUE-300 IOPAMIDOL (ISOVUE-300) INJECTION 61% COMPARISON:  09/07/2015 FINDINGS: Lower chest:   Lung bases are unremarkable. Hepatobiliary: Enhanced liver shows no biliary ductal dilatation. There are small subcentimeter scattered low-density lesions within liver the largest in right hepatic lobe measures 5 mm. These are indeterminate too small to be characterized. No calcified gallstones are noted within gallbladder. No CBD dilatation. Pancreas: Enhanced pancreas is unremarkable. Spleen: Enhanced spleen is unremarkable. Adrenals/Urinary Tract: No adrenal gland mass is noted. Enhanced kidneys are symmetrical in size. There is nonobstructive calcified calculus in lower pole of the right kidney measures 6.8 mm. Nonobstructive calculus in lower pole of the left kidney measures 3 mm. There is a cyst in midpole of the right kidney measures 1.8 cm. Cyst in upper pole of the right kidney measures 1 cm. Delayed renal images shows bilateral renal symmetrical excretion. Bilateral visualized proximal ureter is unremarkable. The urinary bladder is unremarkable. No calcified calculi are noted within urinary bladder. Stomach/Bowel: There is no gastric outlet obstruction. Mild distended small bowel loops are noted in mid abdomen. Subtle mild thickening of wall of small bowel loops in right abdomen please see axial images 29 and 30 on delay renal images. Findings are suspicious  for segmental enteritis. There is no evidence of small bowel obstruction. The terminal ileum is unremarkable. No pericecal inflammation. Normal appendix is noted in axial image 32. No contrast material is noted in distal small bowel or within colon. Vascular/Lymphatic: There is no retroperitoneal adenopathy. Atherosclerotic calcifications of abdominal aorta and iliac arteries. No aortic aneurysm. No mesenteric adenopathy or fluid collection. Reproductive: Prostate gland and seminal vesicles are unremarkable. Some colonic stool and gas noted in transverse colon. Scattered diverticula are noted in descending colon and proximal sigmoid colon. No evidence  of acute diverticulitis. Moderate stool noted in distal sigmoid colon and rectum. Other: There is a left inguinal scrotal canal hernia containing fat measures 2.8 cm. Small right inguinal scrotal canal hernia containing fat measures 1.2 cm. No evidence of acute complication. Musculoskeletal: No destructive bony lesions are noted. There are degenerative changes thoracolumbar spine. IMPRESSION: 1. Mild distended proximal small bowel loops are noted in mid upper abdomen please see axial image 47. There is some segmental thickening of small bowel wall in right mid abdomen best seen on delay renal images axial image 28. Findings suspicious for segmental enteritis. Clinical correlation is necessary. Less likely ileus or bowel obstruction. No transition point in caliber of small bowel. 2. Normal appendix. No pericecal inflammation. Unremarkable terminal ileum. 3. Bilateral nonobstructive nephrolithiasis. No hydronephrosis or hydroureter. Right renal cysts are noted. Bilateral renal symmetrical excretion. 4. There are scattered subcentimeter low-density lesions within liver the largest measures 4.5 mm. This are indeterminate. Further evaluation with enhanced MRI is recommended as clinically warranted. 5. Mild degenerative changes lumbar spine. 6. Atherosclerotic calcifications of abdominal aorta and iliac arteries. 7. Moderate stool noted in distal sigmoid colon and rectum. 8. Colonic diverticula are noted descending colon proximal sigmoid colon. No evidence of acute diverticulitis. 9. Bilateral inguinal scrotal canal hernia containing fat left greater than right without evidence acute complication. Electronically Signed   By: Natasha Mead M.D.   On: 12/25/2015 20:30   US Renal  12/25/2015  CLINICAL DATA:  Left-sided flank pain EXAM: RENAL / URINARY TRACT ULTRASOUND COMPLETE COMPARISON:  12/05/2015 FINDINGS: Right Kidney: Length: 13.9 cm. 1.2 cm stone is noted in the lower pole without obstructive change. A 2.4 cm cyst is  noted in the mid pole. No hydronephrosis is seen. Left Kidney: Length: 13.3 cm. 1.4 cm calculus is noted within the left kidney without obstructive change. Bladder: Appears normal for degree of bladder distention. IMPRESSION: Bilateral renal calculi without obstructive change. Right renal cyst. Electronically Signed   By: Alcide Clever M.D.   On: 12/25/2015 16:37   I have personally reviewed and evaluated these images and lab results as part of my medical decision-making.   EKG Interpretation   Date/Time:  Thursday December 25 2015 17:26:19 EDT Ventricular Rate:  72 PR Interval:  187 QRS Duration: 80 QT Interval:  364 QTC Calculation: 398 R Axis:   48 Text Interpretation:  Sinus rhythm Low voltage, precordial leads No  significant change since last tracing Confirmed by [facility] MD, Cong Hightower  ([id number]) on 12/25/2015 5:31:16 PM      MDM   Final diagnoses:  Right sided abdominal pain  Non-intractable vomiting with nausea, vomiting of unspecified type  Enteritis  Liver lesion  Diverticulosis of intestine without bleeding, unspecified intestinal tract location   55 year old male with a history of hyperlipidemia and nephrolithiasis presents with concern for abdominal pain which she reports is consistent with prior nephrolithiasis. Given patient reporting this pain similar to prior, initiated evaluation with renal ultrasound  and x-ray which did not show any sign of obstructive nephrolithiasis. Lab work does not show any sign of hepatitis or pancreatitis. On reevaluation, patient is continuing to have abdominal pain, with tenderness in the right lower quadrant, and CT abdomen with contrast was ordered which showed likely enteritis.  History does not suggest obstruction or ileus. Do not suspect bacterial etiology at this time. Patient feels improved after medications in ED.  Will rx for supportive care, zofran, zantac, bentyl.   Discussed other incidental findings with pt and recommend outpt MRI. Case  management provided outpt resources.   Patient discharged in stable condition with understanding of reasons to return.   Alvira Monday, MD 12/26/15 [phone]

## 2015-12-25 NOTE — ED Notes (Signed)
Patient transported to CT 

## 2015-12-25 NOTE — ED Notes (Signed)
Per pt, states history of kidney stones-right flank pain that started 2 hours ago

## 2015-12-25 NOTE — Progress Notes (Signed)
Patient listed as having [place] without a pcp.  EDCM spoke to patient at bedside.  Patient reports he usually uses the [facility] on [address].  [facility] of [facility] who accept [place] insurance within a 25 mile radius of patient's zip code [phone].  Patient thankful for services.  No further EDCM needs at this time.

## 2015-12-25 NOTE — ED Notes (Signed)
UNABLE TO COLLECT LABS ULTRASOUND IS IN THE ROOM

## 2016-10-26 IMAGING — CT CT ABD-PELV W/ CM
2 of 5 series · 14 of 46 positions shown, 16 images · IV contrast (omnipaque)
Comparison: 09/07/2015

CLINICAL DATA: History of kidney stones, right flank pain starting
2 hours ago, possible appendicitis

EXAM:
CT ABDOMEN AND PELVIS WITH CONTRAST
TECHNIQUE: Multidetector CT imaging of the abdomen and pelvis was performed
using the standard protocol following bolus administration of
intravenous contrast.
CONTRAST:  25mL OMNIPAQUE IOHEXOL 300 MG/ML SOLN, 100mL [id number]
IOPAMIDOL ([id number]) INJECTION 61%

[Series 2: abd/pel with · axial · 0.82mm/px · z∈[+939,+1419]mm · 11 of 110 slices shown, 13 images]
[im 7/110  soft-tissue]
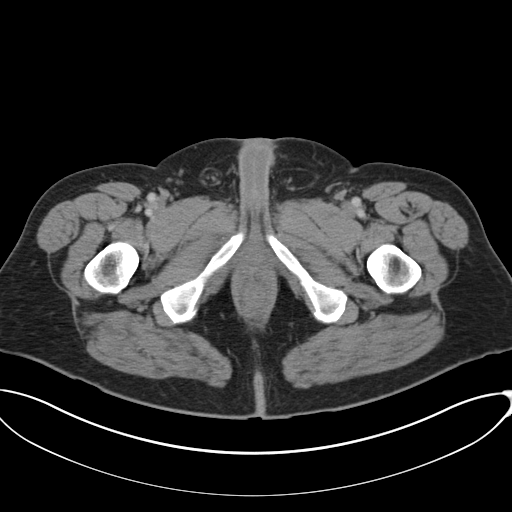
[im 7/110  bone]
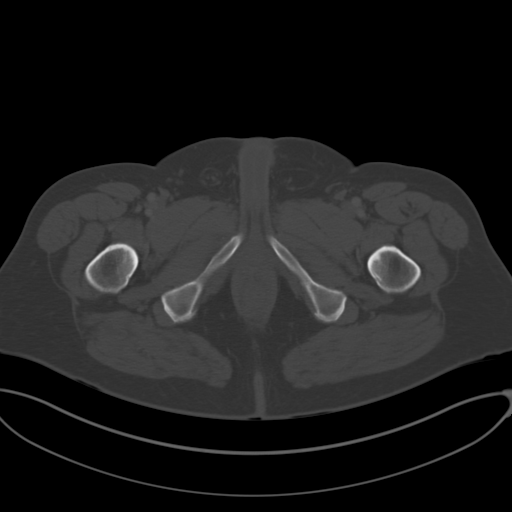
[im 21/110  soft-tissue]
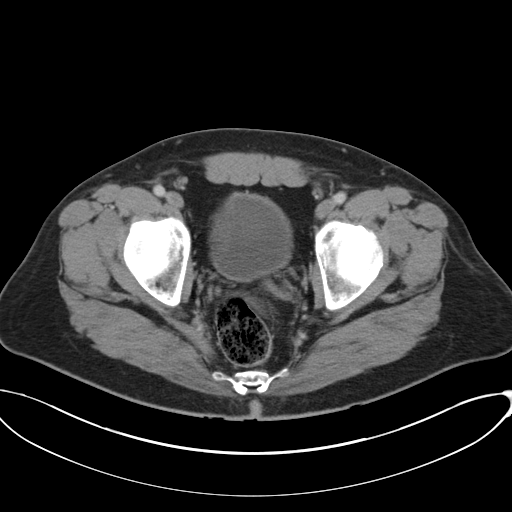
[im 28/110  soft-tissue]
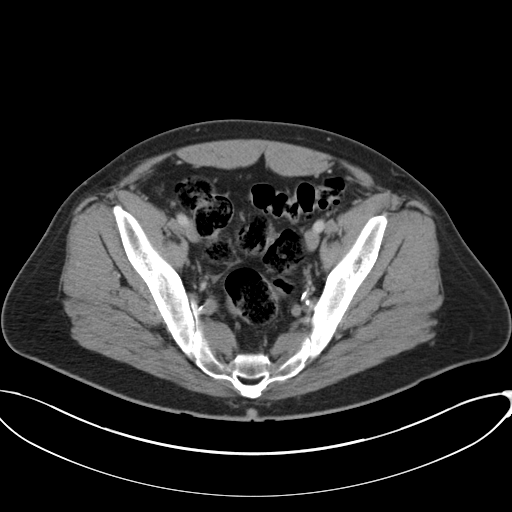
[im 35/110  soft-tissue]
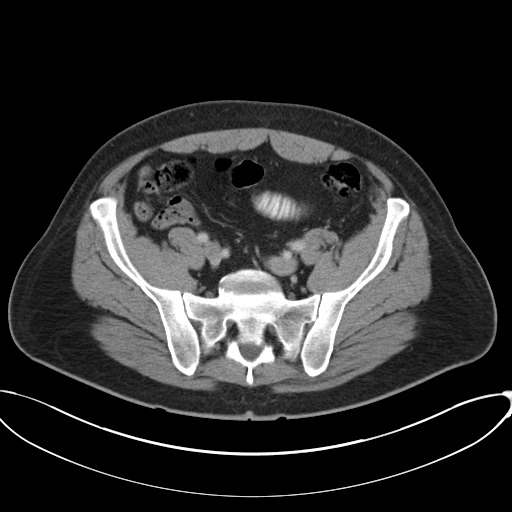
[im 48/110  soft-tissue]
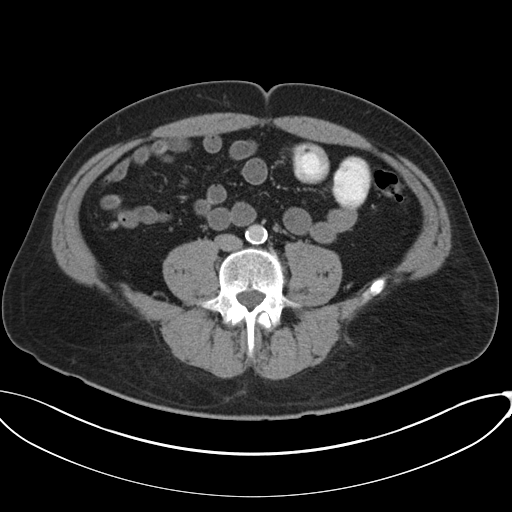
[im 55/110  soft-tissue]
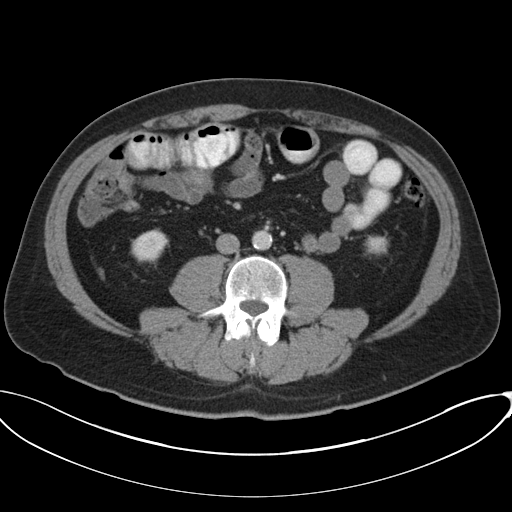
[im 62/110  soft-tissue]
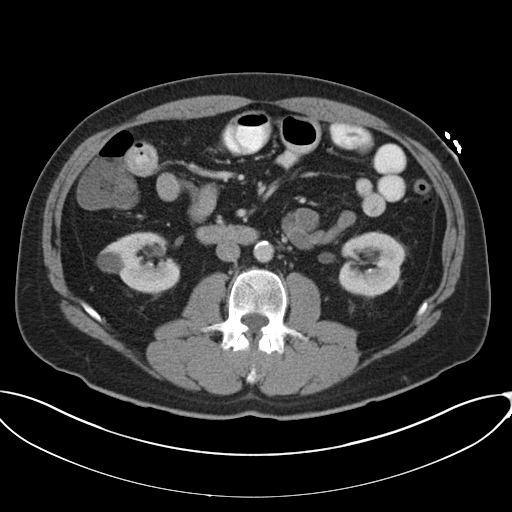
[im 75/110  soft-tissue]
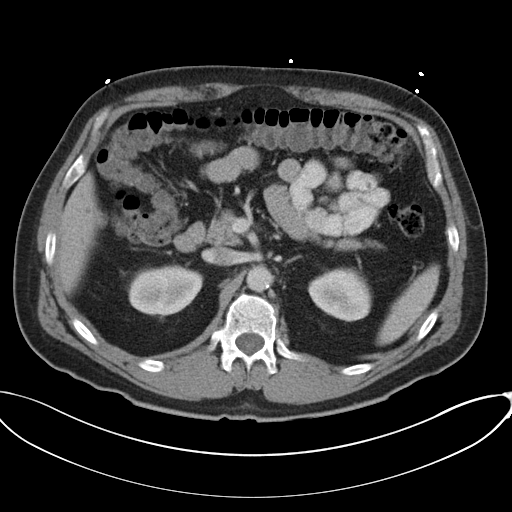
[im 82/110  soft-tissue]
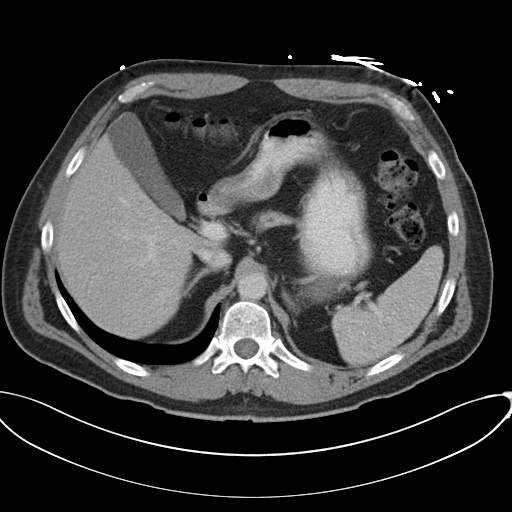
[im 82/110  bone]
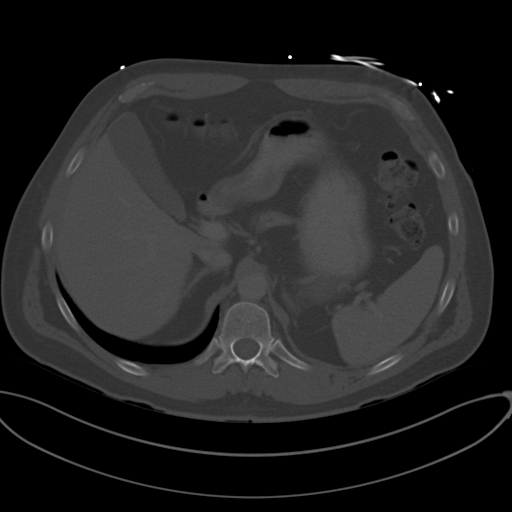
[im 89/110  soft-tissue]
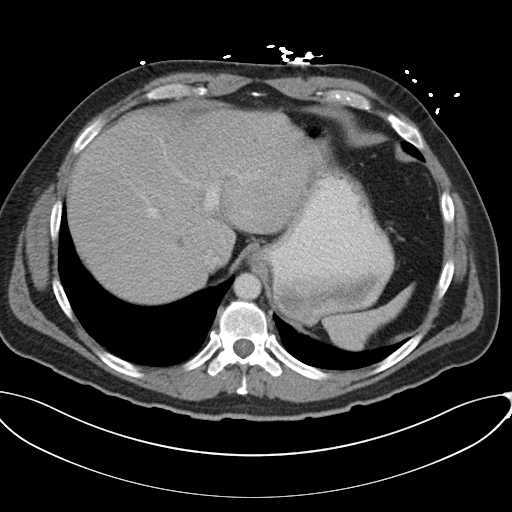
[im 103/110  soft-tissue]
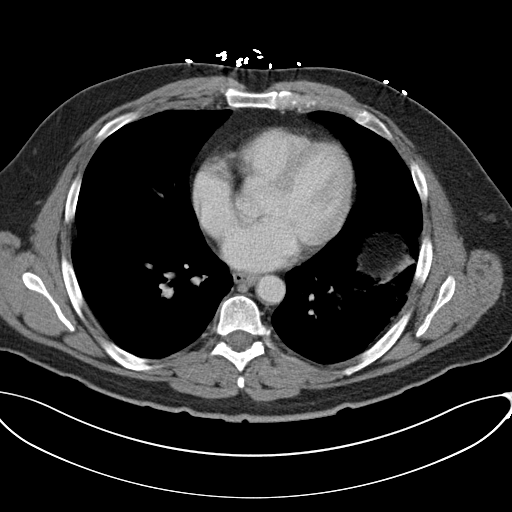

[Series 5: coronal a/|p · coronal · 0.76mm/px · 3 of 145 slices shown]
[im 49/145  soft-tissue]
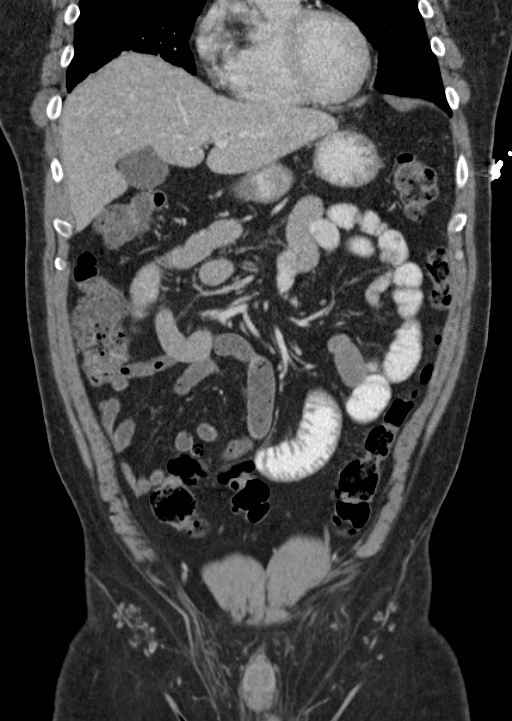
[im 65/145  soft-tissue]
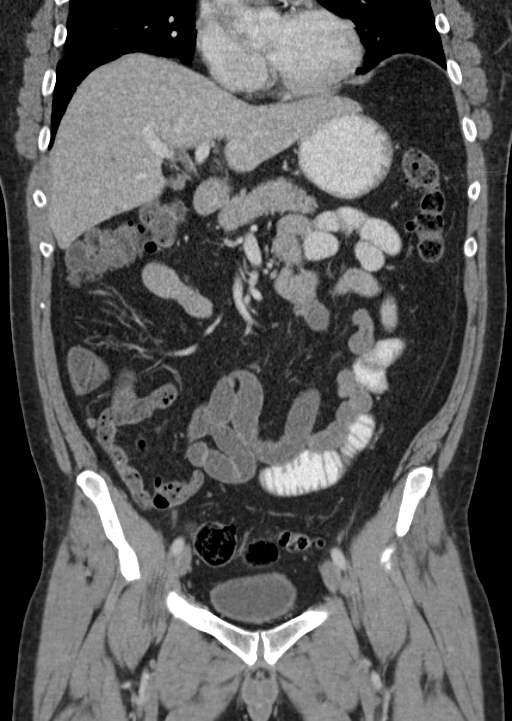
[im 81/145  soft-tissue]
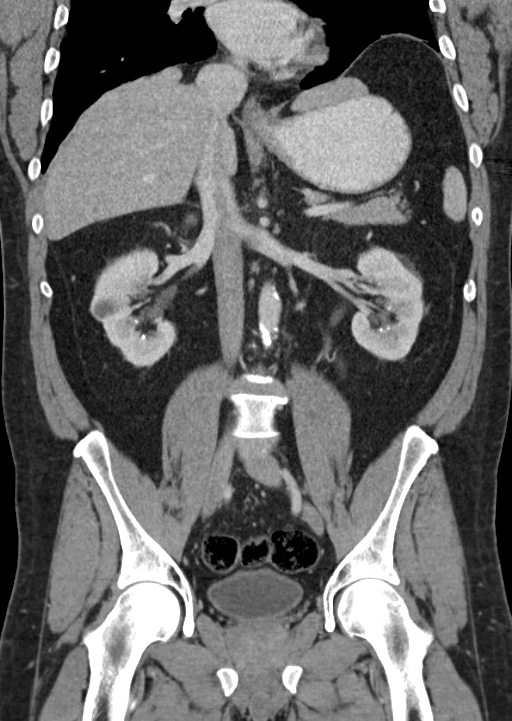

[14 of 46 positions shown; findings below may reference images not displayed]

FINDINGS: Lower chest:  Lung bases are unremarkable.

Hepatobiliary: Enhanced liver shows no biliary ductal dilatation.
There are small subcentimeter scattered low-density lesions within
liver the largest in right hepatic lobe measures 5 mm. These are
indeterminate too small to be characterized.

No calcified gallstones are noted within gallbladder. No CBD
dilatation.

Pancreas: Enhanced pancreas is unremarkable.

Spleen: Enhanced spleen is unremarkable.

Adrenals/Urinary Tract: No adrenal gland mass is noted. Enhanced
kidneys are symmetrical in size. There is nonobstructive calcified
calculus in lower pole of the right kidney measures 6.8 mm.
Nonobstructive calculus in lower pole of the left kidney measures 3
mm. There is a cyst in midpole of the right kidney measures 1.8 cm.
Cyst in upper pole of the right kidney measures 1 cm. Delayed renal
images shows bilateral renal symmetrical excretion. Bilateral
visualized proximal ureter is unremarkable.

The urinary bladder is unremarkable. No calcified calculi are noted
within urinary bladder.

Stomach/Bowel: There is no gastric outlet obstruction. Mild
distended small bowel loops are noted in mid abdomen. Subtle mild
thickening of wall of small bowel loops in right abdomen please see
axial images 29 and 30 on delay renal images. Findings are
suspicious for segmental enteritis. There is no evidence of small
bowel obstruction. The terminal ileum is unremarkable. No pericecal
inflammation. Normal appendix is noted in axial image 32. No
contrast material is noted in distal small bowel or within colon.

Vascular/Lymphatic: There is no retroperitoneal adenopathy.
Atherosclerotic calcifications of abdominal aorta and iliac
arteries. No aortic aneurysm. No mesenteric adenopathy or fluid
collection.

Reproductive: Prostate gland and seminal vesicles are unremarkable.
Some colonic stool and gas noted in transverse colon. Scattered
diverticula are noted in descending colon and proximal sigmoid
colon. No evidence of acute diverticulitis. Moderate stool noted in
distal sigmoid colon and rectum.

Other: There is a left inguinal scrotal canal hernia containing fat
measures 2.8 cm. Small right inguinal scrotal canal hernia
containing fat measures 1.2 cm. No evidence of acute complication.

Musculoskeletal: No destructive bony lesions are noted. There are
degenerative changes thoracolumbar spine.
IMPRESSION: 1. Mild distended proximal small bowel loops are noted in mid upper
abdomen please see axial image 47. There is some segmental
thickening of small bowel wall in right mid abdomen best seen on
delay renal images axial image 28. Findings suspicious for segmental
enteritis. Clinical correlation is necessary. Less likely ileus or
bowel obstruction. No transition point in caliber of small bowel.
2. Normal appendix. No pericecal inflammation. Unremarkable terminal
ileum.
3. Bilateral nonobstructive nephrolithiasis. No hydronephrosis or
hydroureter. Right renal cysts are noted. Bilateral renal
symmetrical excretion.
4. There are scattered subcentimeter low-density lesions within
liver the largest measures 4.5 mm. This are indeterminate. Further
evaluation with enhanced MRI is recommended as clinically warranted.
5. Mild degenerative changes lumbar spine.
6. Atherosclerotic calcifications of abdominal aorta and iliac
arteries.
7. Moderate stool noted in distal sigmoid colon and rectum.
8. Colonic diverticula are noted descending colon proximal sigmoid
colon. No evidence of acute diverticulitis.
9. Bilateral inguinal scrotal canal hernia containing fat left
greater than right without evidence acute complication.

## 2017-04-16 IMAGING — US US RENAL
1 series · 14 of 25 positions shown · non-contrast
Comparison: 12/05/2015

CLINICAL DATA: Left-sided flank pain

EXAM:
RENAL / URINARY TRACT ULTRASOUND COMPLETE

[Series 1: us renal · 0.31mm/px · 14 of 42 slices shown]
[im 1/42]
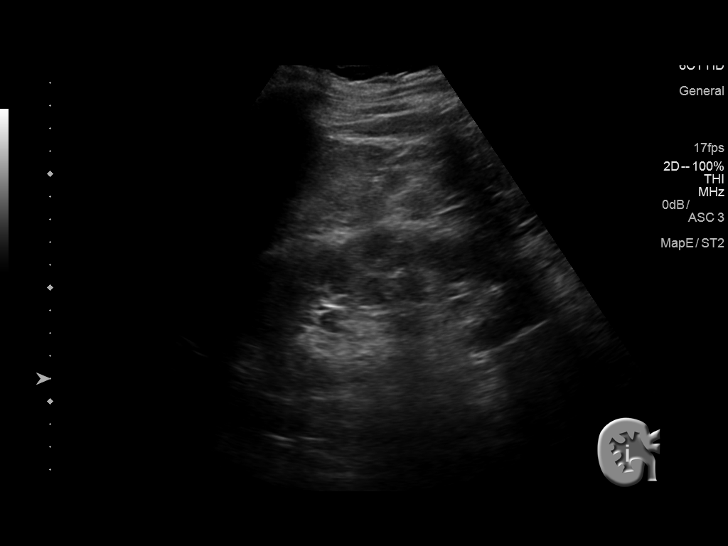
[im 4/42]
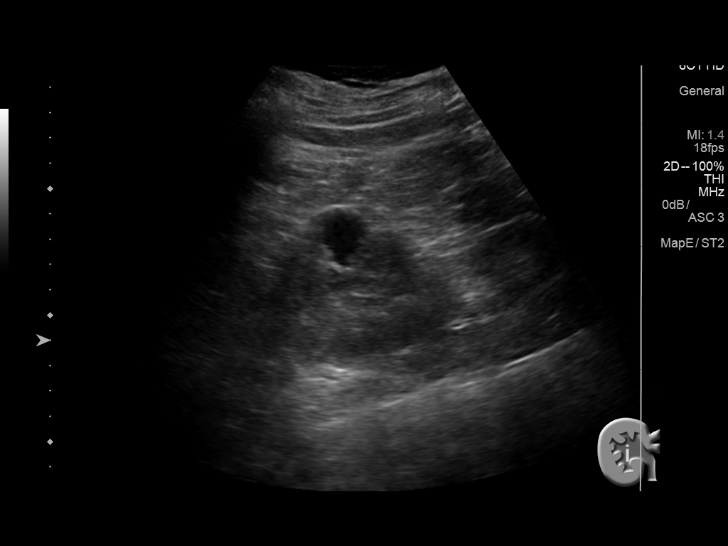
[im 7/42]
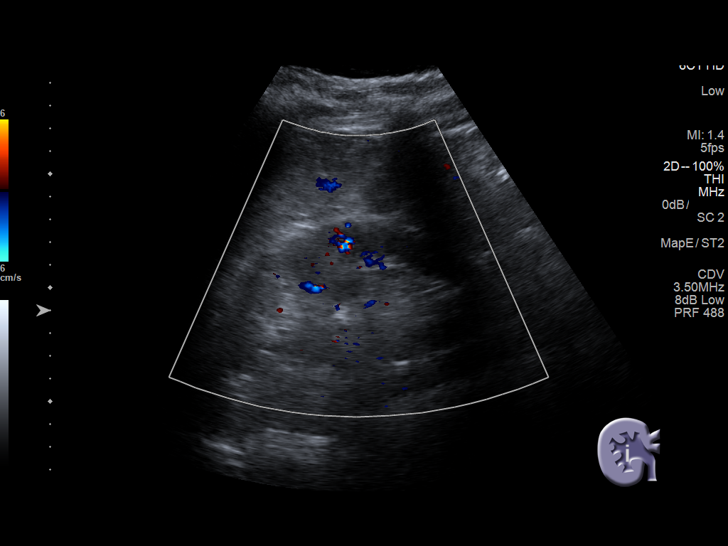
[im 11/42]
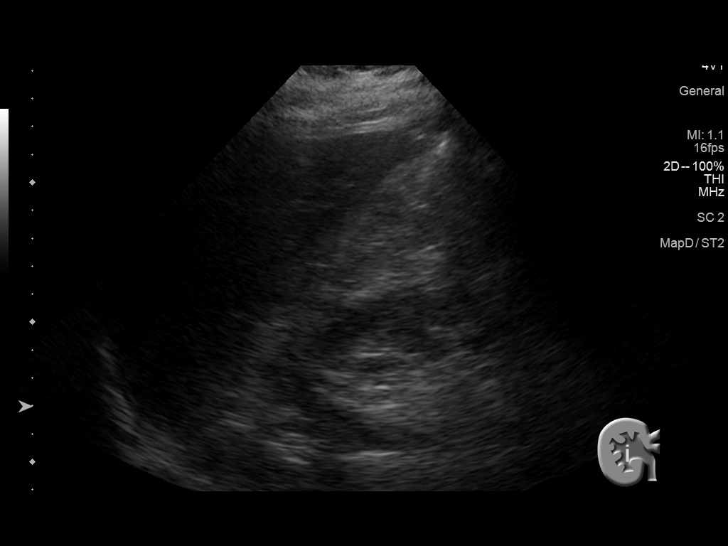
[im 14/42]
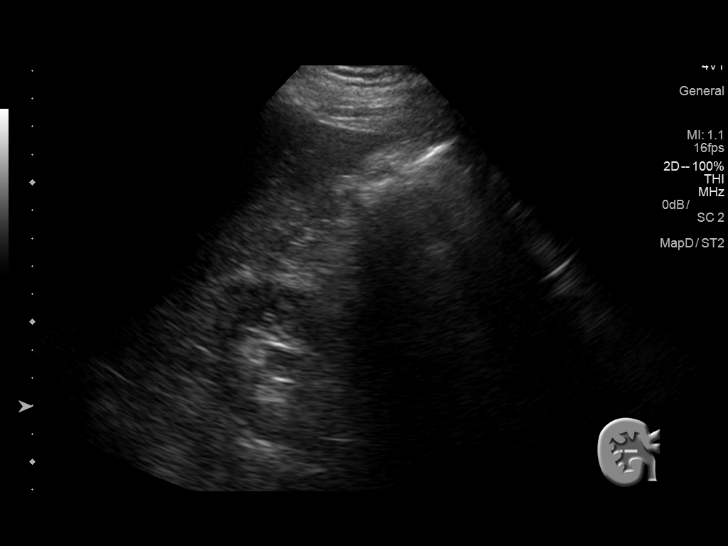
[im 16/42]
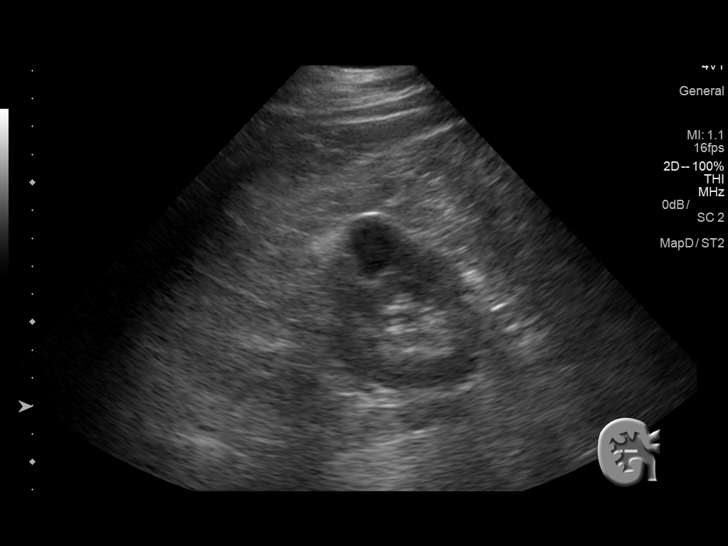
[im 19/42]
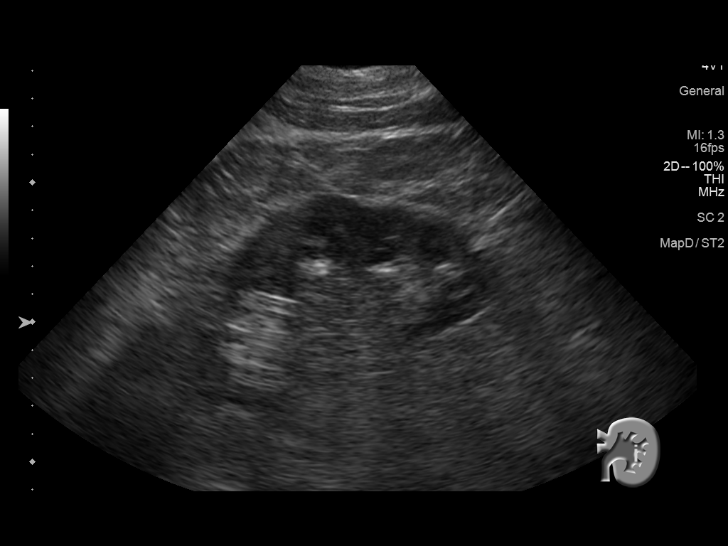
[im 23/42]
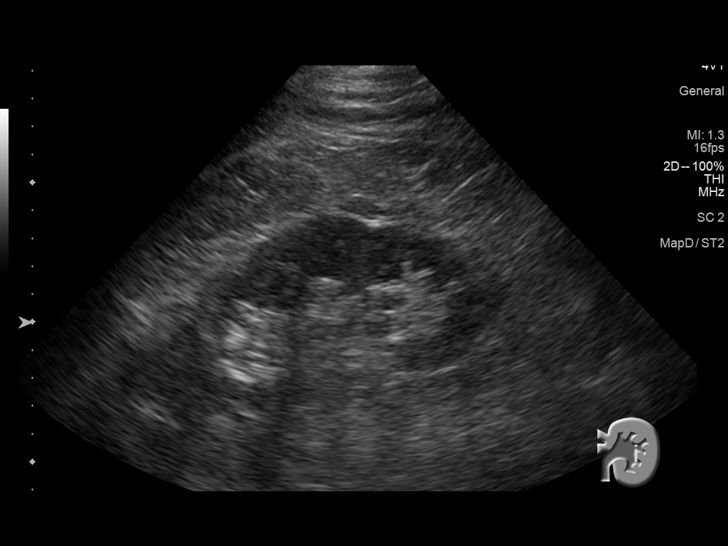
[im 26/42]
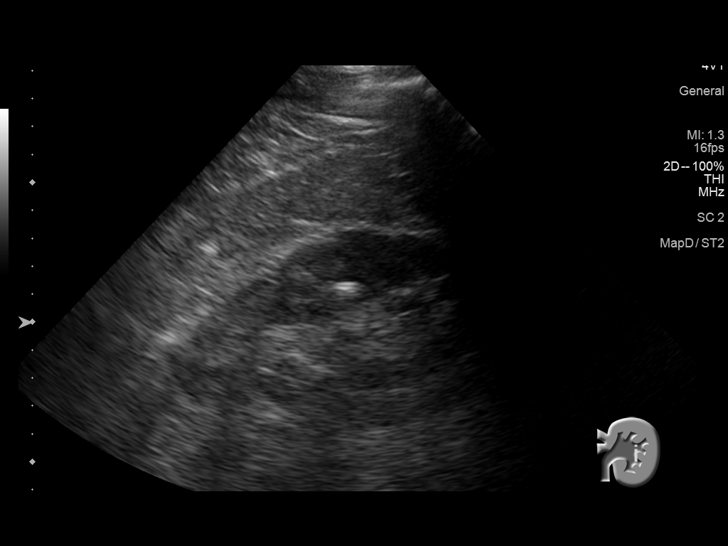
[im 28/42]
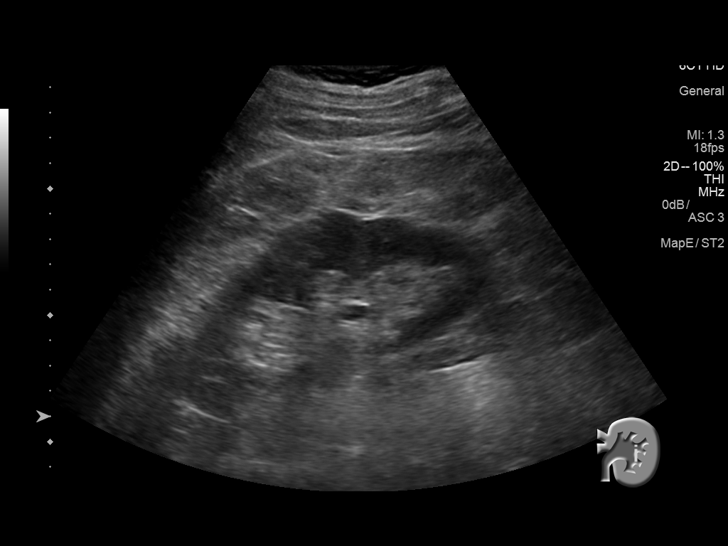
[im 31/42]
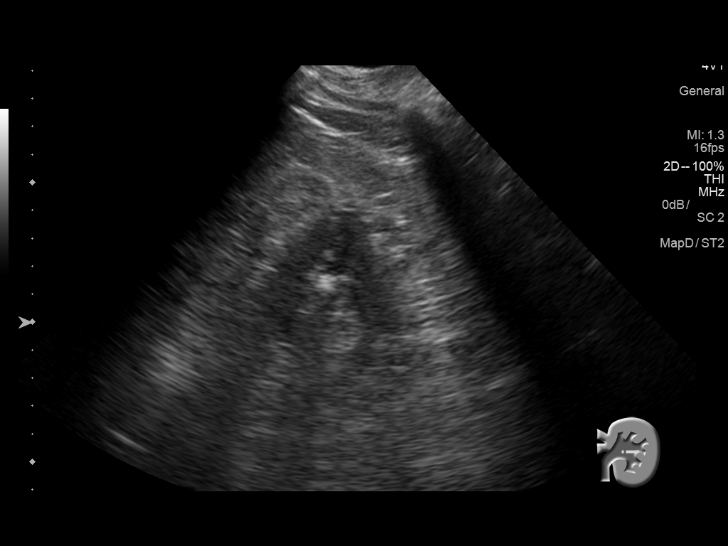
[im 35/42]
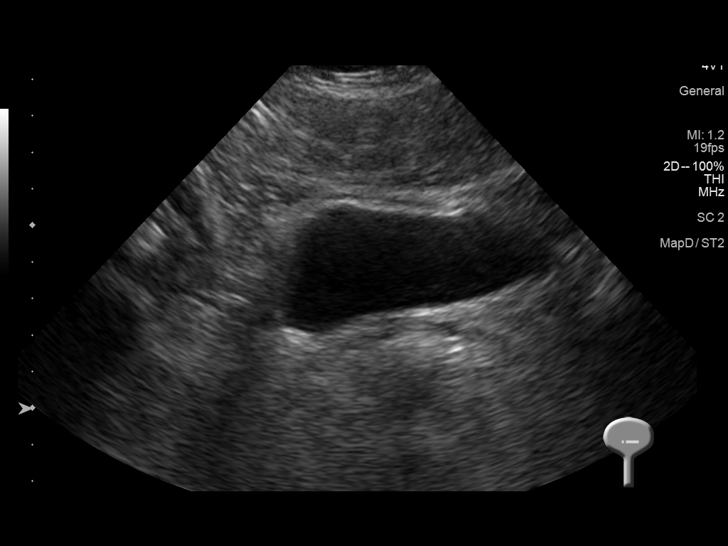
[im 38/42]
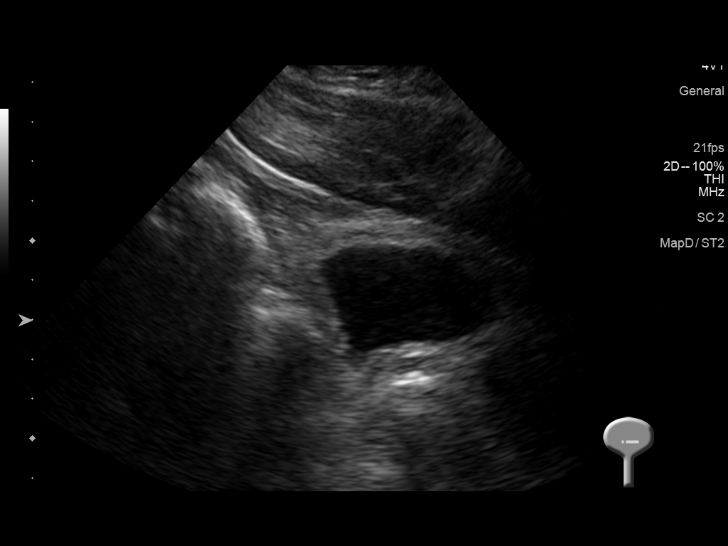
[im 42/42]
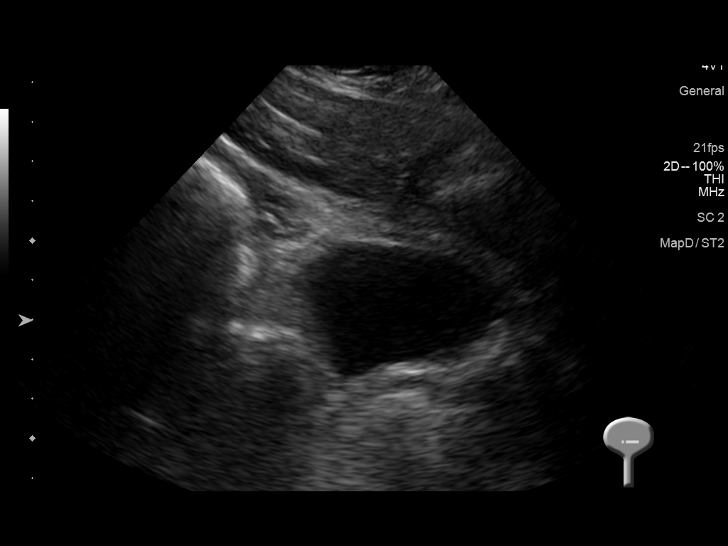

[14 of 25 positions shown; findings below may reference images not displayed]

FINDINGS: Right Kidney:

Length: 13.9 cm.. 1.2 cm stone is noted in the lower pole without
obstructive change. A 2.4 cm cyst is noted in the mid pole. No
hydronephrosis is seen.

Left Kidney:

Length: 13.3 cm.. 1.4 cm calculus is noted within the left kidney
without obstructive change.

Bladder:

Appears normal for degree of bladder distention.
IMPRESSION: Bilateral renal calculi without obstructive change.

Right renal cyst.

## 2018-08-22 ENCOUNTER — Encounter: Payer: Self-pay | Admitting: Gastroenterology

## 2018-09-05 ENCOUNTER — Encounter: Payer: Self-pay | Admitting: *Deleted

## 2018-09-29 ENCOUNTER — Encounter: Payer: BLUE CROSS/BLUE SHIELD | Admitting: Gastroenterology
